# Patient Record
Sex: Female | Born: 1965 | Hispanic: Yes | Marital: Married | Smoking: Never smoker
Health system: Southern US, Community
[De-identification: ages and names within clinical notes are randomized; demographics above are authoritative.]

## PROBLEM LIST (undated history)

## (undated) DIAGNOSIS — E785 Hyperlipidemia, unspecified: Secondary | ICD-10-CM

## (undated) DIAGNOSIS — I10 Essential (primary) hypertension: Secondary | ICD-10-CM

## (undated) DIAGNOSIS — E119 Type 2 diabetes mellitus without complications: Secondary | ICD-10-CM

## (undated) HISTORY — DX: Type 2 diabetes mellitus without complications: E11.9

## (undated) HISTORY — DX: Essential (primary) hypertension: I10

## (undated) HISTORY — DX: Hyperlipidemia, unspecified: E78.5

---

## 2007-08-10 ENCOUNTER — Ambulatory Visit: Payer: Self-pay

## 2015-03-06 ENCOUNTER — Ambulatory Visit: Payer: Self-pay

## 2015-03-20 ENCOUNTER — Ambulatory Visit: Payer: Self-pay | Attending: Oncology

## 2015-03-20 ENCOUNTER — Ambulatory Visit
Admission: RE | Admit: 2015-03-20 | Discharge: 2015-03-20 | Disposition: A | Discharge status: Home / Self Care | Payer: Self-pay | Source: Ambulatory Visit | Attending: Oncology | Admitting: Oncology

## 2015-03-20 VITALS — BP 129/85 | HR 82 | Temp 96.8°F | Resp 18 | Ht 59.06 in | Wt 146.6 lb

## 2015-03-20 DIAGNOSIS — Z0000 Encounter for general adult medical examination without abnormal findings: Secondary | ICD-10-CM

## 2015-03-20 NOTE — Progress Notes (Signed)
Subjective:     Patient ID: Robyne AskewCarmen S Balon, female   DOB: 02/16/1966, 49 y.o.   MRN: [id number]  HPI   Review of Systems     Objective:   Physical Exam  Pulmonary/Chest: Right breast exhibits no inverted nipple, no mass, no nipple discharge, no skin change and no tenderness. Left breast exhibits no inverted nipple, no mass, no nipple discharge, no skin change and no tenderness. Breasts are symmetrical.       Assessment:     49 year old patient presents for [facility] clinic visitPatient screened, and meets [facility] eligibility.  Patient does not have insurance, Medicare or Medicaid.  Handout given on Affordable Care Act. Instructed patient on breast self-exam using teach back method.  CBE unremarkable. 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 Delos HaringLoyda Murr interpreted exam.    Plan:     Sent for bilateral screening mammogram.

## 2015-03-21 NOTE — Progress Notes (Signed)
Letter mailed from [facility] to notify of normal mammogram results.  Patient to return in one year for annual screening.  Copy to [facility]. 

## 2017-03-08 ENCOUNTER — Ambulatory Visit
Admission: RE | Admit: 2017-03-08 | Discharge: 2017-03-08 | Disposition: A | Discharge status: Home / Self Care | Payer: Self-pay | Source: Ambulatory Visit | Attending: Oncology | Admitting: Oncology

## 2017-03-08 ENCOUNTER — Encounter: Payer: Self-pay | Admitting: *Deleted

## 2017-03-08 ENCOUNTER — Encounter (INDEPENDENT_AMBULATORY_CARE_PROVIDER_SITE_OTHER): Payer: Self-pay

## 2017-03-08 ENCOUNTER — Ambulatory Visit: Payer: Self-pay | Attending: Oncology | Admitting: *Deleted

## 2017-03-08 VITALS — BP 133/91 | HR 63 | Temp 97.1°F | Resp 20 | Ht <= 58 in | Wt 145.0 lb

## 2017-03-08 DIAGNOSIS — Z0000 Encounter for general adult medical examination without abnormal findings: Secondary | ICD-10-CM

## 2017-03-08 NOTE — Progress Notes (Addendum)
Subjective:     Patient ID: Cheryl AskewCarmen S Barrett, female   DOB: 12/09/1965, 51 y.o.   MRN: [id number]  HPI   Review of Systems     Objective:   Physical Exam  Pulmonary/Chest: Right breast exhibits no inverted nipple, no mass, no nipple discharge, no skin change and no tenderness. Left breast exhibits no inverted nipple, no mass, no nipple discharge, no skin change and no tenderness. Breasts are symmetrical.       Assessment:     51 [facility] Hispanic [facility]  Cheryl Barrett, the interpreter present during the interview and exam.  Clinical breast exam unremarkable.  Taught self breast awareness.  Patient complains of pain at her left axilla.  States she also has left shoulder and arm pain.  States her primary care provider told her it was muscular pain.  Denies heavy lifting for trauma.  States she is using Aleve or Tylenol with good results. Patient states she has an appointment on 03/30/17 for her pap smear with her primary care provider.  Patient has been screened for eligibility.  She does not have any insurance, Medicare or Medicaid.  She also meets financial eligibility.  Hand-out given on the Affordable Care Act.    Plan:     Screening mammogram ordered.  Will follow-up per [facility] protocol.

## 2017-03-08 NOTE — Patient Instructions (Signed)
Gave patient hand-out, Women Staying Healthy, Active and Well from [facility], with education on breast health, pap smears, heart and colon health. 

## 2017-03-09 ENCOUNTER — Other Ambulatory Visit: Payer: Self-pay | Admitting: *Deleted

## 2017-03-09 DIAGNOSIS — N6489 Other specified disorders of breast: Secondary | ICD-10-CM

## 2017-03-25 ENCOUNTER — Ambulatory Visit
Admission: RE | Admit: 2017-03-25 | Discharge: 2017-03-25 | Disposition: A | Discharge status: Home / Self Care | Payer: Self-pay | Source: Ambulatory Visit | Attending: Oncology | Admitting: Oncology

## 2017-03-25 DIAGNOSIS — N6489 Other specified disorders of breast: Secondary | ICD-10-CM

## 2017-04-01 ENCOUNTER — Encounter: Payer: Self-pay | Admitting: *Deleted

## 2017-04-01 NOTE — Progress Notes (Signed)
Letter mailed to inform patient of her normal mammogram results.  She is to follow up with annual screening in one year.  [facility] [place].

## 2018-03-30 ENCOUNTER — Ambulatory Visit: Payer: Self-pay

## 2018-05-30 ENCOUNTER — Ambulatory Visit: Payer: Self-pay | Attending: Oncology

## 2019-10-11 ENCOUNTER — Ambulatory Visit: Payer: Self-pay | Attending: Oncology | Admitting: *Deleted

## 2019-10-11 ENCOUNTER — Ambulatory Visit
Admission: RE | Admit: 2019-10-11 | Discharge: 2019-10-11 | Disposition: A | Discharge status: Home / Self Care | Payer: Self-pay | Source: Ambulatory Visit | Attending: Oncology | Admitting: Oncology

## 2019-10-11 ENCOUNTER — Encounter: Payer: Self-pay | Admitting: *Deleted

## 2019-10-11 ENCOUNTER — Other Ambulatory Visit: Payer: Self-pay

## 2019-10-11 DIAGNOSIS — Z0000 Encounter for general adult medical examination without abnormal findings: Secondary | ICD-10-CM | POA: Insufficient documentation

## 2019-10-11 NOTE — Progress Notes (Signed)
  Subjective:     Patient ID: Cheryl Barrett, female   DOB: May 21, 1966, 54 y.o.   MRN: [id number]  HPI   Review of Systems     Objective:   Physical Exam     Assessment:     Due to Covid 19 pandemic a televisit was used to enroll patient into our B[facility]CP program and obtain her health history.  [employer] Trula Ore [id number] was used for interpretation.  2 identifiers were used to ensure I was talking to the correct patient.  Patient denies any breast problems.  Last pap on 06/08/17 was negative without HPV co-testing.  Patient will be due for her next pap in October 2021.  She can get her pap through her pcp or we can complete her pap at her next [facility] appointment in 2022. Patient has been screened for eligibility.  She does not have any insurance, Medicare or Medicaid.  She also meets financial eligibility.   Risk Assessment    Risk Scores      10/11/2019   Last edited by: Jim Like, RN   5-year risk: 0.6 %   Lifetime risk: 4.2 %            Plan:     Screening mammogram ordered.  Will follow up per [facility] protocol.

## 2019-10-11 NOTE — Progress Notes (Signed)
Letter mailed from [facility] to inform patient of her normal mammogram results.  Patient is to follow-up with annual screening in one year. 

## 2020-12-03 ENCOUNTER — Encounter: Payer: Self-pay | Admitting: *Deleted

## 2020-12-03 ENCOUNTER — Ambulatory Visit
Admission: RE | Admit: 2020-12-03 | Discharge: 2020-12-03 | Disposition: A | Discharge status: Home / Self Care | Payer: Self-pay | Source: Ambulatory Visit | Attending: Oncology | Admitting: Oncology

## 2020-12-03 ENCOUNTER — Encounter (INDEPENDENT_AMBULATORY_CARE_PROVIDER_SITE_OTHER): Payer: Self-pay

## 2020-12-03 ENCOUNTER — Ambulatory Visit: Payer: Self-pay | Attending: Oncology | Admitting: *Deleted

## 2020-12-03 ENCOUNTER — Other Ambulatory Visit: Payer: Self-pay

## 2020-12-03 VITALS — BP 137/76 | HR 63 | Temp 97.9°F | Ht <= 58 in | Wt 149.4 lb

## 2020-12-03 DIAGNOSIS — Z0000 Encounter for general adult medical examination without abnormal findings: Secondary | ICD-10-CM

## 2020-12-03 NOTE — Patient Instructions (Signed)
Gave patient hand-out, Women Staying Healthy, Active and Well from [facility], with education on breast health, pap smears, heart and colon health. 

## 2020-12-03 NOTE — Progress Notes (Signed)
Subjective:     Patient ID: Cheryl Barrett, female   DOB: 05-Jun-1966, 55 y.o.   MRN: [id number]  HPI   [facility] Medical History Record - 12/03/20 1008      Breast History   Screening cycle New    Provider (CBE) Cheryl Barrett    Initial Mammogram 12/03/20    Last Mammogram Annual    Last Mammogram Date 10/11/19    Provider (Mammogram)  Cheryl Barrett    Recent Breast Symptoms None      Breast Cancer History   Breast Cancer History No personal or family history      Previous History of Breast Problems   Breast Surgery or Biopsy None    Breast Implants N/A    BSE Done Monthly      Gynecological/Obstetrical History   LMP --   55 yrears   Is there any chance that the client could be pregnant?  No    Age at menarche 67    Age at menopause 82    PAP smear history Annually    Date of last PAP  11/19/20    Provider (PAP) Cheryl Barrett per    Age at first live birth 81    Breast fed children Yes (type length in comments)   2 years   DES Exposure No    Cervical, Uterine or Ovarian cancer No    Family history of Cervial, Uterine or Ovarian cancer No    Hysterectomy No    Cervix removed No    Ovaries removed No    Laser/Cryosurgery Yes   at [facility] 7 years ago   Current method of birth control None    Current method of Estrogen/Hormone replacement None    Smoking history None    Comments No insurance            Review of Systems     Objective:   Physical Exam Chest:  Breasts:     Right: No swelling, bleeding, inverted nipple, mass, nipple discharge, skin change, tenderness, axillary adenopathy or supraclavicular adenopathy.     Left: No swelling, bleeding, inverted nipple, mass, nipple discharge, skin change, tenderness, axillary adenopathy or supraclavicular adenopathy.    Lymphadenopathy:     Upper Body:     Right upper body: No supraclavicular or axillary adenopathy.     Left upper body: No supraclavicular or axillary adenopathy.        Assessment:     55 year old  Hispanic female returns to [facility] for annual screening.  Cheryl Barrett, the interpreter present during the interview and exam.  Clinical breast exam unremarkable.  Taught self breast awareness.  Patient states she sometimes has left breast/axilla pain.  She also states she has left shoulder and arm pain.  States she was informed by PCP that the pain was muscular in nature and to take Tylenol.  States she gets relief with the Tylenol.  Last pap was 2 weeks ago at the [facility].  Patient states she was told her next pap would be in 5 years.  Patient has been screened for eligibility.  She does not have any insurance, Medicare or Medicaid.  She also meets financial eligibility.   Risk Assessment    Risk Scores      12/03/2020 10/11/2019   Last edited by: Cheryl Presto, RN Cheryl Like, RN   5-year risk: 0.6 % 0.6 %   Lifetime risk: 4.1 % 4.2 %  Plan:     Screening mammogram ordered.  Will follow up per [facility] protocol.

## 2020-12-04 ENCOUNTER — Encounter: Payer: Self-pay | Admitting: *Deleted

## 2020-12-04 NOTE — Progress Notes (Signed)
Letter mailed from [facility] to inform patient of her normal mammogram results.  Patient is to follow-up with annual screening in one year. 

## 2021-12-17 IMAGING — MG DIGITAL SCREENING BILAT W/ TOMO W/ CAD
8 series · 8 of 24 positions shown · non-contrast
Comparison: Previous exam(s).

CLINICAL DATA: Screening.

EXAM:
DIGITAL SCREENING BILATERAL MAMMOGRAM WITH TOMO AND CAD

[L MLO synth-2D]
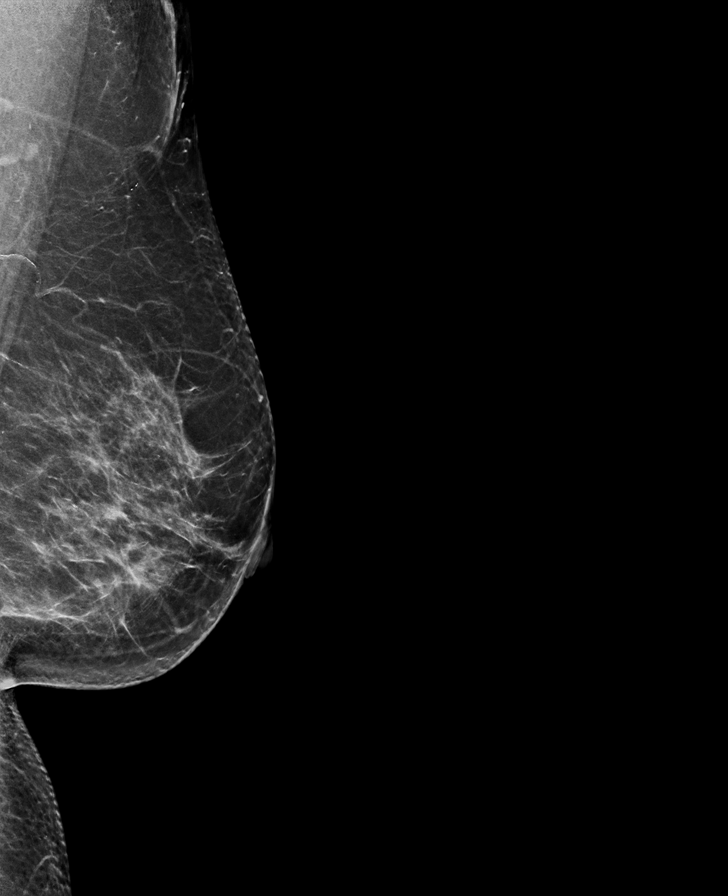

[R MLO synth-2D]
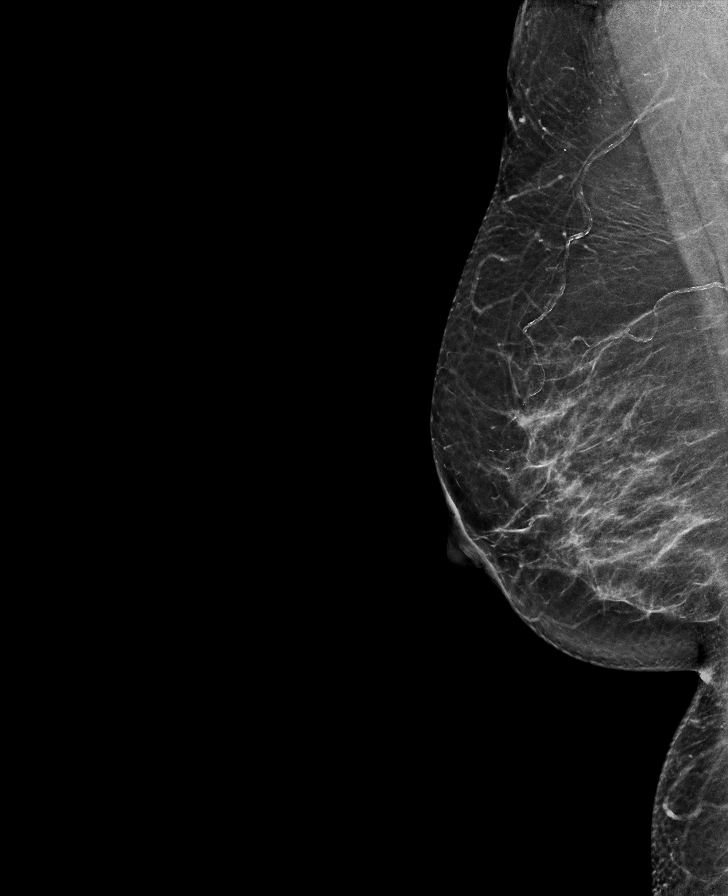

[L CC synth-2D]
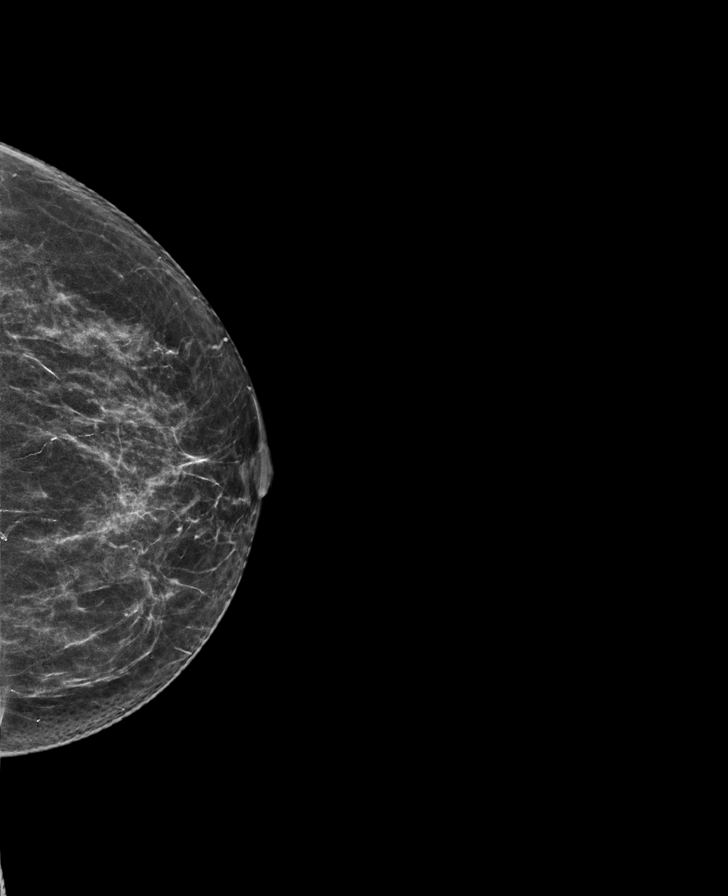

[R CC synth-2D]
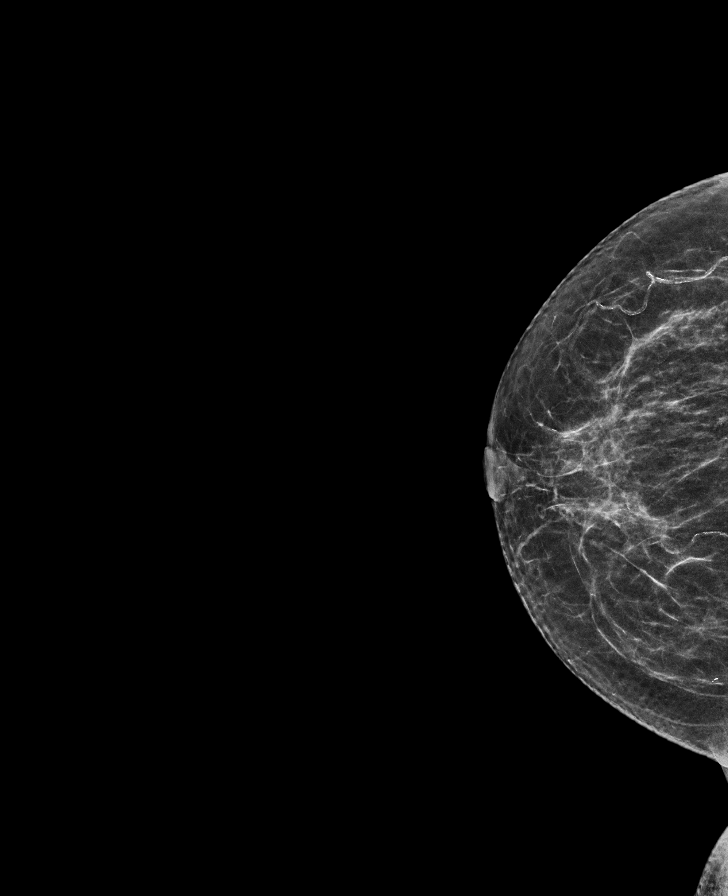

[L MLO tomo · tomo slice 34/67.0]
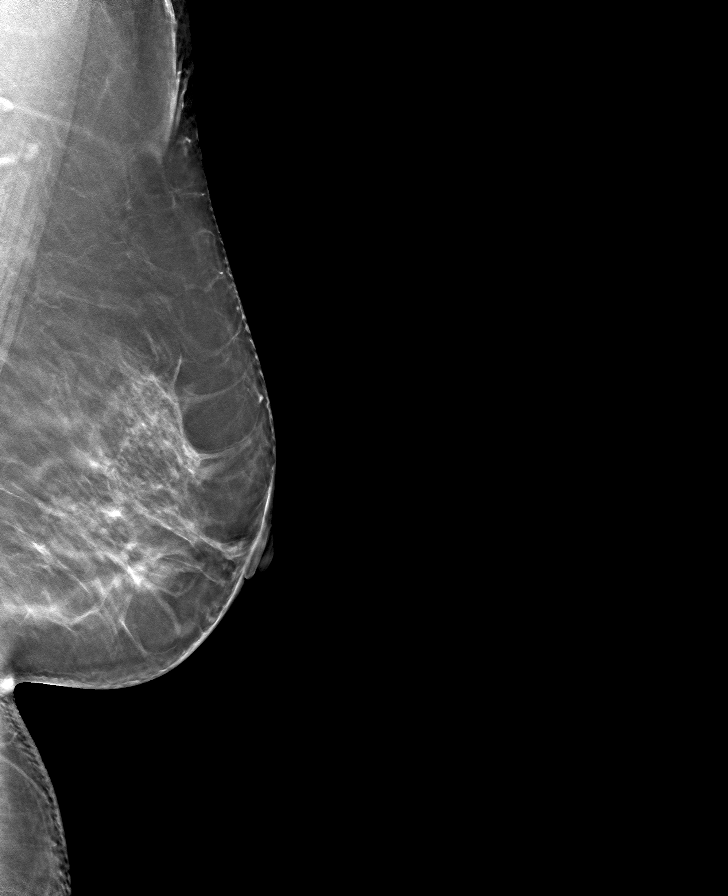

[R MLO tomo · tomo slice 34/67.0]
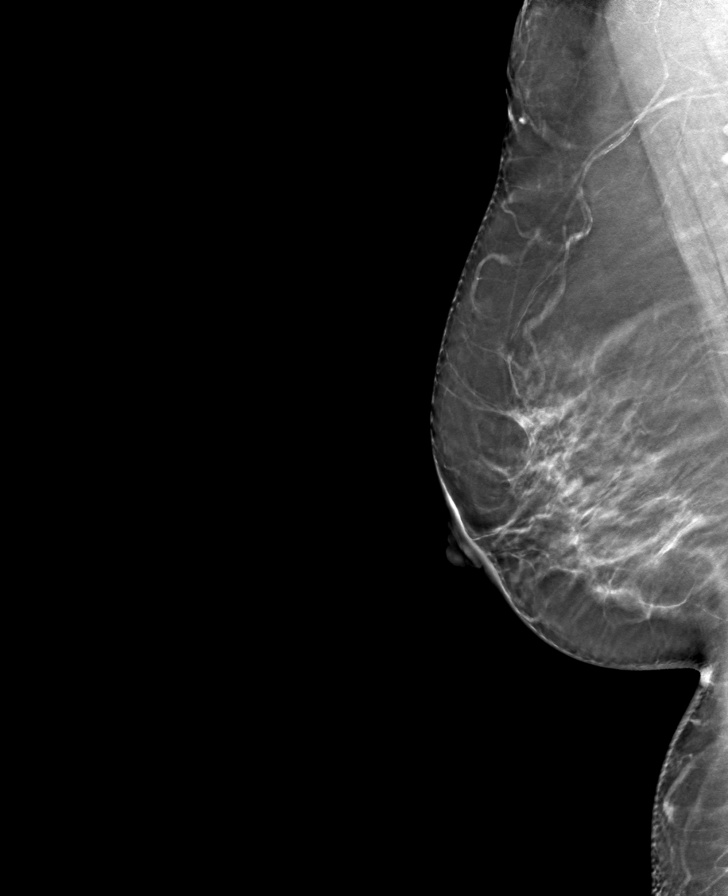

[R CC tomo · tomo slice 29/56.0]
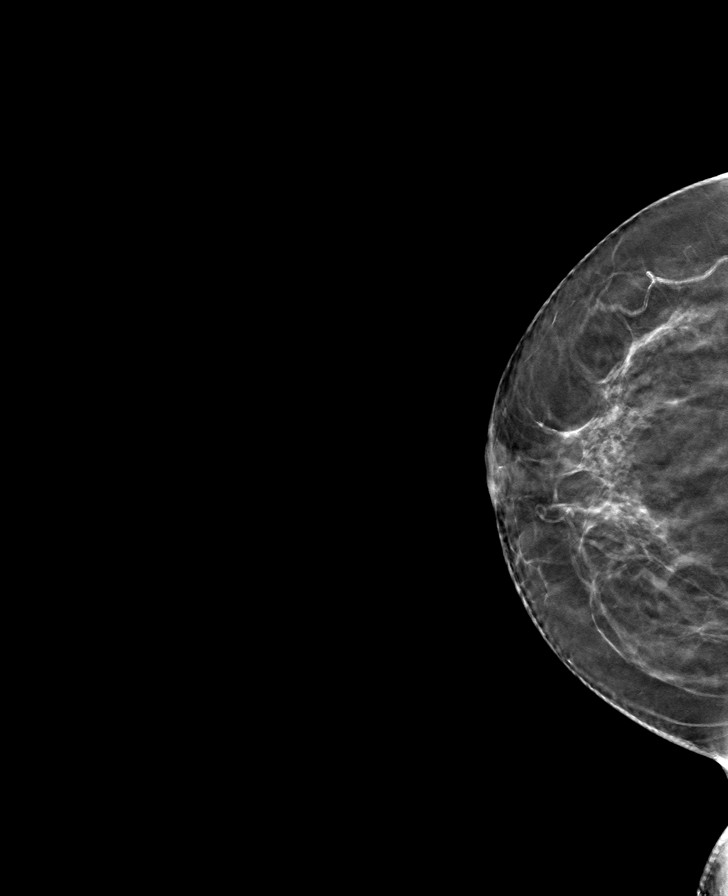

[L CC tomo · tomo slice 29/58.0]
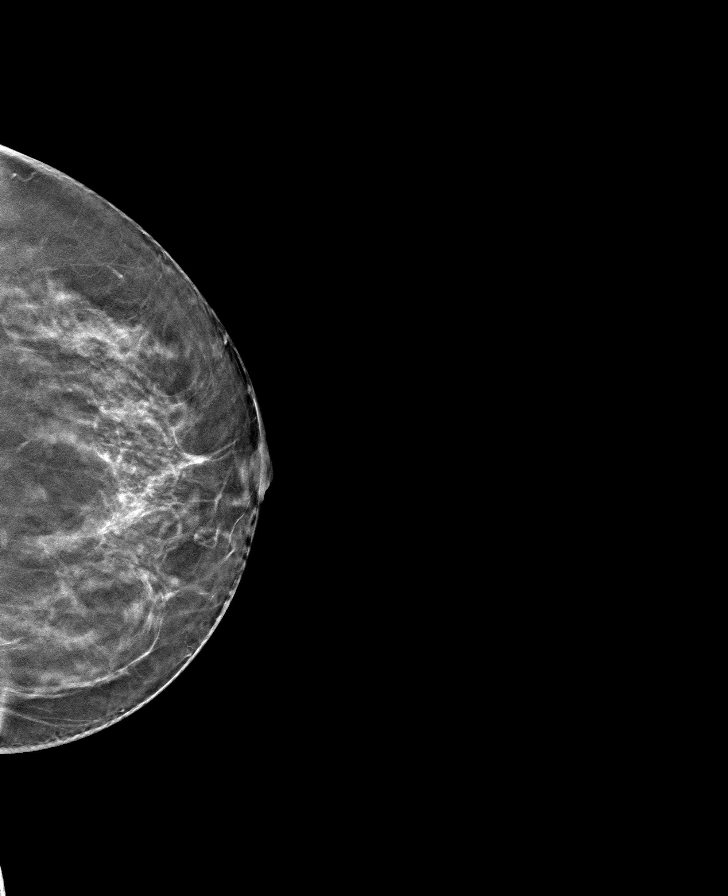

[8 of 24 positions shown; findings below may reference images not displayed]

ACR Breast Density Category b: There are scattered areas of
fibroglandular density.
FINDINGS: There are no findings suspicious for malignancy. Images were
processed with CAD.
IMPRESSION: No mammographic evidence of malignancy. A result letter of this
screening mammogram will be mailed directly to the patient.

RECOMMENDATION:
Screening mammogram in one year. (Code:CN-U-775)

BI-RADS CATEGORY  1: Negative.

## 2022-04-23 ENCOUNTER — Encounter: Payer: Self-pay | Admitting: Family Medicine

## 2022-06-22 ENCOUNTER — Ambulatory Visit: Payer: Self-pay

## 2022-07-01 ENCOUNTER — Other Ambulatory Visit: Payer: Self-pay

## 2022-07-01 DIAGNOSIS — Z1231 Encounter for screening mammogram for malignant neoplasm of breast: Secondary | ICD-10-CM

## 2022-07-06 ENCOUNTER — Ambulatory Visit
Admission: RE | Admit: 2022-07-06 | Discharge: 2022-07-06 | Disposition: A | Discharge status: Home / Self Care | Payer: Self-pay | Source: Ambulatory Visit | Attending: Obstetrics and Gynecology | Admitting: Obstetrics and Gynecology

## 2022-07-06 ENCOUNTER — Ambulatory Visit: Payer: Self-pay | Attending: Hematology and Oncology | Admitting: Hematology and Oncology

## 2022-07-06 VITALS — BP 122/59 | Wt 142.7 lb

## 2022-07-06 DIAGNOSIS — Z1231 Encounter for screening mammogram for malignant neoplasm of breast: Secondary | ICD-10-CM | POA: Insufficient documentation

## 2022-07-06 NOTE — Patient Instructions (Signed)
[name] about breast self awareness. Patient did not need a Pap smear today due to last Pap smear was in 2022 and was normal per patient. Let her know [facility] will cover Pap smears every 5 years unless has a history of abnormal Pap smears. [facility]. Appointment scheduled for 07/06/2022. Patient aware of appointment and will be there. Let patient know will follow up with her within the next couple weeks with results. Cheryl Barrett verbalized understanding. She will return to clinic in one year for repeat mammogram and 4 years for repeat Pap smear.   Melodye Ped, NP 9:52 AM

## 2022-07-06 NOTE — Progress Notes (Signed)
[name] is a 56 y.o. female who presents to [facility] clinic today with no complaints.    Pap Smear: Pap not smear completed today. Last Pap smear was 2022 at [facility] clinic and was normal. Per patient has no history of an abnormal Pap smear. Last Pap smear result is available in Epic.   Physical exam: Breasts Breasts symmetrical. No skin abnormalities bilateral breasts. No nipple retraction bilateral breasts. No nipple discharge bilateral breasts. No lymphadenopathy. No lumps palpated bilateral breasts.     MS DIGITAL SCREENING TOMO BILATERAL  Result Date: 12/04/2020 CLINICAL DATA:  Screening. EXAM: DIGITAL SCREENING BILATERAL MAMMOGRAM WITH TOMOSYNTHESIS AND CAD TECHNIQUE: Bilateral screening digital craniocaudal and mediolateral oblique mammograms were obtained. Bilateral screening digital breast tomosynthesis was performed. The images were evaluated with computer-aided detection. COMPARISON:  Previous exam(s). ACR Breast Density Category b: There are scattered areas of fibroglandular density. FINDINGS: There are no findings suspicious for malignancy. The images were evaluated with computer-aided detection. IMPRESSION: No mammographic evidence of malignancy. A result letter of this screening mammogram will be mailed directly to the patient. RECOMMENDATION: Screening mammogram in one year. (Code:SM-B-01Y) BI-RADS CATEGORY  1: Negative. Electronically Signed   By: Dorise Bullion III M.D   On: 12/04/2020 14:20   MS DIGITAL SCREENING TOMO BILATERAL  Result Date: 10/11/2019 CLINICAL DATA:  Screening. EXAM: DIGITAL SCREENING BILATERAL MAMMOGRAM WITH TOMO AND CAD COMPARISON:  Previous exam(s). ACR Breast Density Category b: There are scattered areas of fibroglandular density. FINDINGS: There are no findings suspicious for malignancy. Images were processed with CAD. IMPRESSION: No mammographic evidence of malignancy. A result letter of this screening mammogram will be mailed directly to the  patient. RECOMMENDATION: Screening mammogram in one year. (Code:SM-B-01Y) BI-RADS CATEGORY  1: Negative. Electronically Signed   By: Lillia Mountain M.D.   On: 10/11/2019 10:23      Pelvic/Bimanual Pap is not indicated today    Smoking History: Patient has never smoked and was not referred to quit line.    Patient Navigation: Patient education provided. Access to services provided for patient through [facility] program. [employer] Knock [phone])  interpreter provided. No transportation provided   Colorectal Cancer Screening: Per patient has never had colonoscopy completed No complaints today. Declined FIT test.    Breast and Cervical Cancer Risk Assessment: Patient does not have family history of breast cancer, known genetic mutations, or radiation treatment to the chest before age 44. Patient does not have history of cervical dysplasia, immunocompromised, or DES exposure in-utero.  Risk Assessment   No risk assessment data for the current encounter  Risk Scores       12/03/2020   Last edited by: Theodore Demark, RN   5-year risk: 0.6 %   Lifetime risk: 4.1 %            A: [facility] exam without pap smear No complaints with benign exam.   P: [facility] mamm[facility]. Appointment scheduled 07/06/2022.  Dayton Scrape A, NP 07/06/2022 9:29 AM

## 2023-07-01 ENCOUNTER — Telehealth: Payer: Self-pay | Admitting: *Deleted

## 2023-07-22 ENCOUNTER — Other Ambulatory Visit: Payer: Self-pay | Admitting: Primary Care

## 2023-07-22 DIAGNOSIS — Z1231 Encounter for screening mammogram for malignant neoplasm of breast: Secondary | ICD-10-CM

## 2023-08-04 ENCOUNTER — Ambulatory Visit
Admission: RE | Admit: 2023-08-04 | Discharge: 2023-08-04 | Disposition: A | Discharge status: Home / Self Care | Payer: Self-pay | Source: Ambulatory Visit | Attending: Primary Care | Admitting: Primary Care

## 2023-08-04 DIAGNOSIS — Z1231 Encounter for screening mammogram for malignant neoplasm of breast: Secondary | ICD-10-CM | POA: Insufficient documentation
